# Patient Record
Sex: Female | Born: 1965 | Hispanic: Yes | Marital: Single | Smoking: Never smoker
Health system: Southern US, Community
[De-identification: ages and names within clinical notes are randomized; demographics above are authoritative.]

## PROBLEM LIST (undated history)

## (undated) DIAGNOSIS — K219 Gastro-esophageal reflux disease without esophagitis: Secondary | ICD-10-CM

## (undated) DIAGNOSIS — E785 Hyperlipidemia, unspecified: Secondary | ICD-10-CM

## (undated) DIAGNOSIS — M51369 Other intervertebral disc degeneration, lumbar region without mention of lumbar back pain or lower extremity pain: Secondary | ICD-10-CM

## (undated) DIAGNOSIS — R112 Nausea with vomiting, unspecified: Secondary | ICD-10-CM

## (undated) DIAGNOSIS — T8859XA Other complications of anesthesia, initial encounter: Secondary | ICD-10-CM

## (undated) DIAGNOSIS — G5600 Carpal tunnel syndrome, unspecified upper limb: Secondary | ICD-10-CM

## (undated) DIAGNOSIS — G4730 Sleep apnea, unspecified: Secondary | ICD-10-CM

## (undated) DIAGNOSIS — I10 Essential (primary) hypertension: Secondary | ICD-10-CM

## (undated) DIAGNOSIS — J45909 Unspecified asthma, uncomplicated: Secondary | ICD-10-CM

## (undated) HISTORY — PX: TRIGGER FINGER RELEASE: SHX641

## (undated) HISTORY — PX: GASTRIC ROUX-EN-Y: SHX5262

## (undated) HISTORY — PX: CARPAL TUNNEL RELEASE: SHX101

## (undated) HISTORY — PX: HERNIA REPAIR: SHX51

## (undated) HISTORY — PX: BREAST EXCISIONAL BIOPSY: SUR124

## (undated) HISTORY — PX: TURBINATE REDUCTION: SHX6157

## (undated) HISTORY — PX: ABDOMINAL HYSTERECTOMY: SHX81

---

## 2012-09-18 DIAGNOSIS — I10 Essential (primary) hypertension: Secondary | ICD-10-CM | POA: Insufficient documentation

## 2012-09-18 DIAGNOSIS — F411 Generalized anxiety disorder: Secondary | ICD-10-CM | POA: Insufficient documentation

## 2012-11-01 DIAGNOSIS — G4733 Obstructive sleep apnea (adult) (pediatric): Secondary | ICD-10-CM | POA: Insufficient documentation

## 2012-11-01 DIAGNOSIS — G4700 Insomnia, unspecified: Secondary | ICD-10-CM | POA: Insufficient documentation

## 2012-11-01 DIAGNOSIS — M791 Myalgia, unspecified site: Secondary | ICD-10-CM | POA: Insufficient documentation

## 2012-11-30 DIAGNOSIS — G5600 Carpal tunnel syndrome, unspecified upper limb: Secondary | ICD-10-CM | POA: Insufficient documentation

## 2013-06-01 ENCOUNTER — Other Ambulatory Visit (HOSPITAL_BASED_OUTPATIENT_CLINIC_OR_DEPARTMENT_OTHER): Payer: Self-pay | Admitting: Internal Medicine

## 2013-06-01 DIAGNOSIS — Z1231 Encounter for screening mammogram for malignant neoplasm of breast: Secondary | ICD-10-CM

## 2013-06-01 DIAGNOSIS — N83519 Torsion of ovary and ovarian pedicle, unspecified side: Secondary | ICD-10-CM

## 2013-06-08 ENCOUNTER — Ambulatory Visit (HOSPITAL_BASED_OUTPATIENT_CLINIC_OR_DEPARTMENT_OTHER)
Admission: RE | Admit: 2013-06-08 | Discharge: 2013-06-08 | Disposition: A | Discharge status: Home / Self Care | Payer: Medicare Other | Source: Ambulatory Visit | Attending: Internal Medicine | Admitting: Internal Medicine

## 2013-06-08 ENCOUNTER — Other Ambulatory Visit: Payer: Self-pay | Admitting: Internal Medicine

## 2013-06-08 ENCOUNTER — Other Ambulatory Visit (HOSPITAL_BASED_OUTPATIENT_CLINIC_OR_DEPARTMENT_OTHER): Payer: Medicare Other

## 2013-06-08 DIAGNOSIS — Z90710 Acquired absence of both cervix and uterus: Secondary | ICD-10-CM | POA: Diagnosis not present

## 2013-06-08 DIAGNOSIS — N8353 Torsion of ovary, ovarian pedicle and fallopian tube: Secondary | ICD-10-CM | POA: Insufficient documentation

## 2013-06-08 DIAGNOSIS — N83519 Torsion of ovary and ovarian pedicle, unspecified side: Secondary | ICD-10-CM

## 2013-06-08 DIAGNOSIS — Z1231 Encounter for screening mammogram for malignant neoplasm of breast: Secondary | ICD-10-CM

## 2013-06-08 IMAGING — US US ART/VEN ABD/PELV/SCROTUM DOPPLER LTD
1 series · 13 of 25 positions shown · non-contrast
Comparison: None.

CLINICAL DATA: Evaluate for ovarian torsion. History of
hysterectomy.

EXAM:
TRANSABDOMINAL AND TRANSVAGINAL ULTRASOUND OF PELVIS
DOPPLER ULTRASOUND OF OVARIES
TECHNIQUE: Both transabdominal and transvaginal ultrasound examinations of the
pelvis were performed. Transabdominal technique was performed for
global imaging of the pelvis including uterus, ovaries, adnexal
regions, and pelvic cul-de-sac.
It was necessary to proceed with endovaginal exam following the
transabdominal exam to visualize the uterus, endometrium, ovaries,
adnexal region. Color and duplex Doppler ultrasound was utilized to
evaluate blood flow to the ovaries.

[Series 1: us art/ven abd/pelv/scrotum doppler ltd · 0.30mm/px · 13 of 51 slices shown]
[im 1/51]
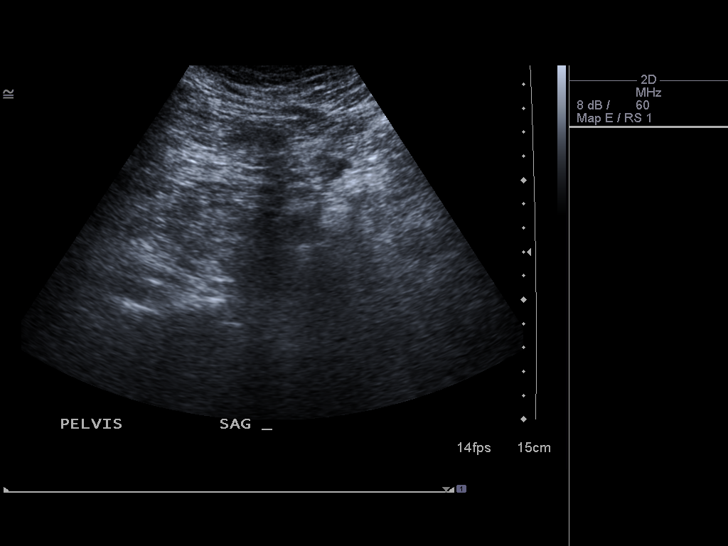
[im 5/51]
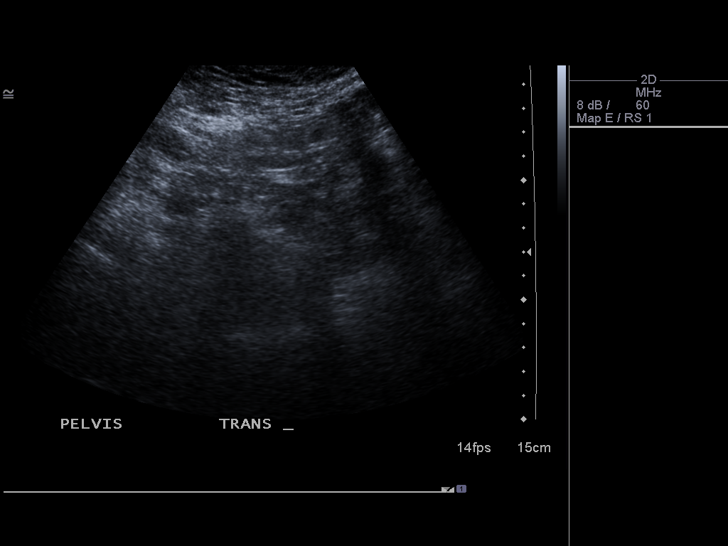
[im 9/51]
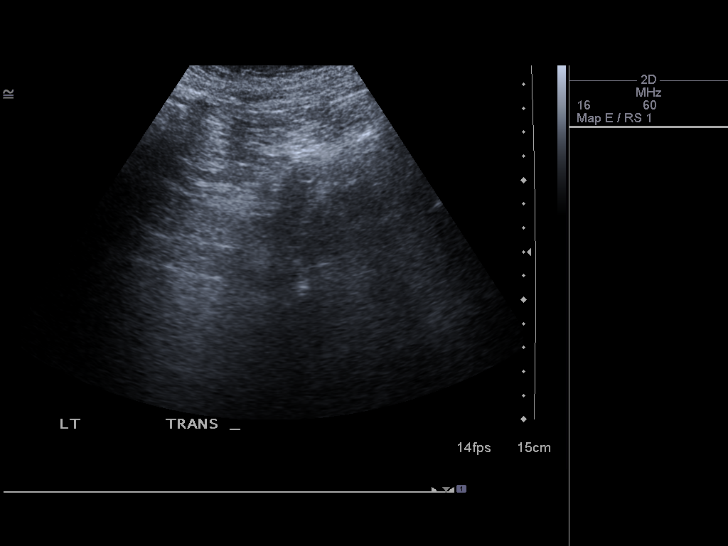
[im 13/51]
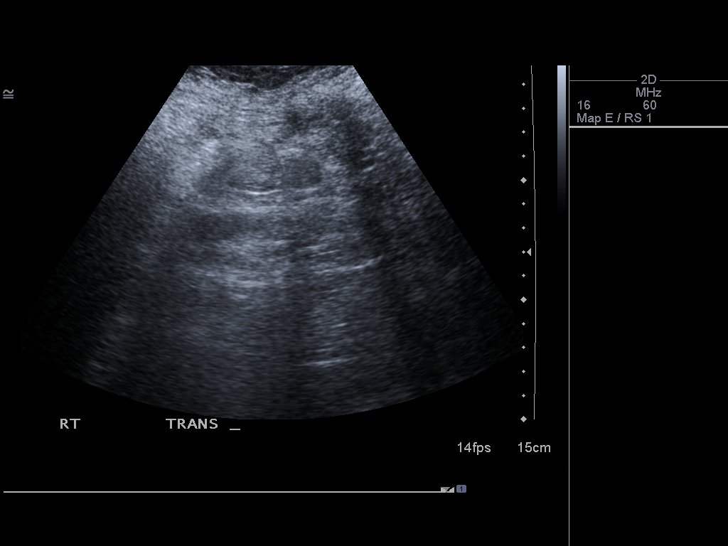
[im 17/51]
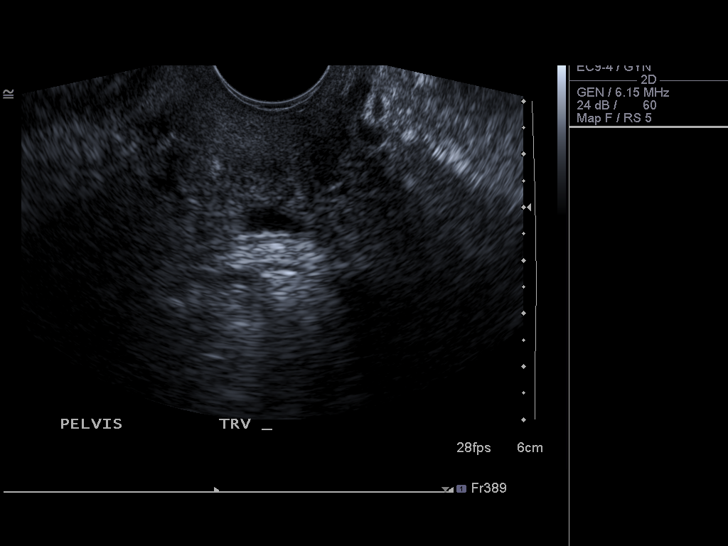
[im 21/51]
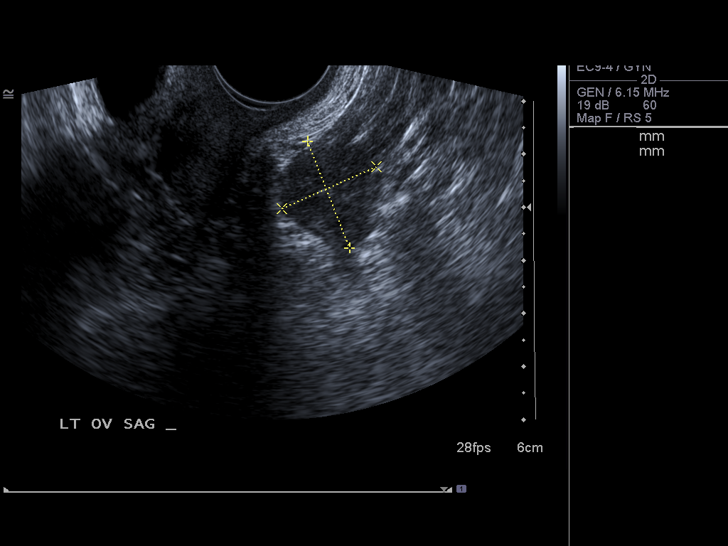
[im 26/51]
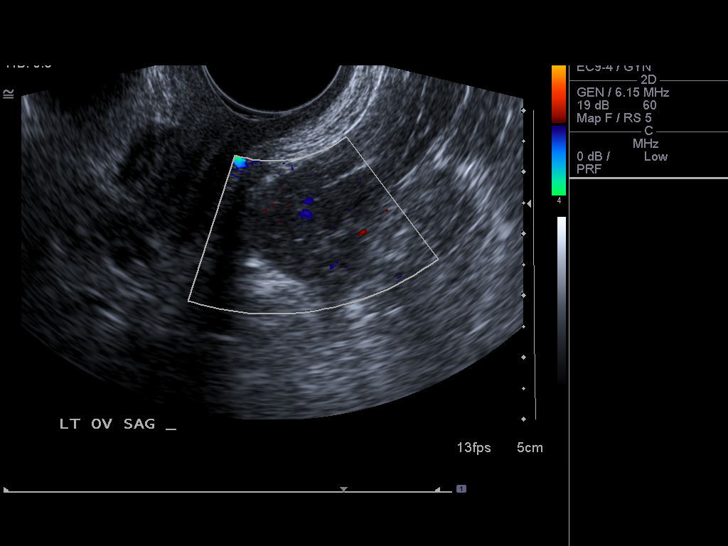
[im 30/51]
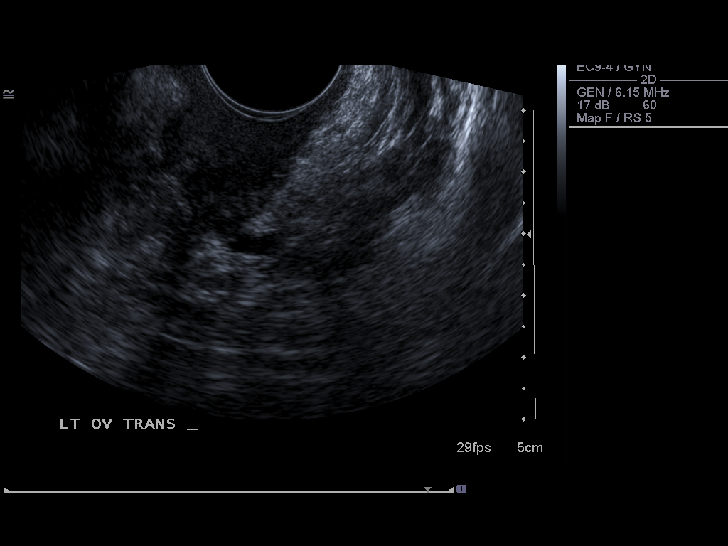
[im 34/51]
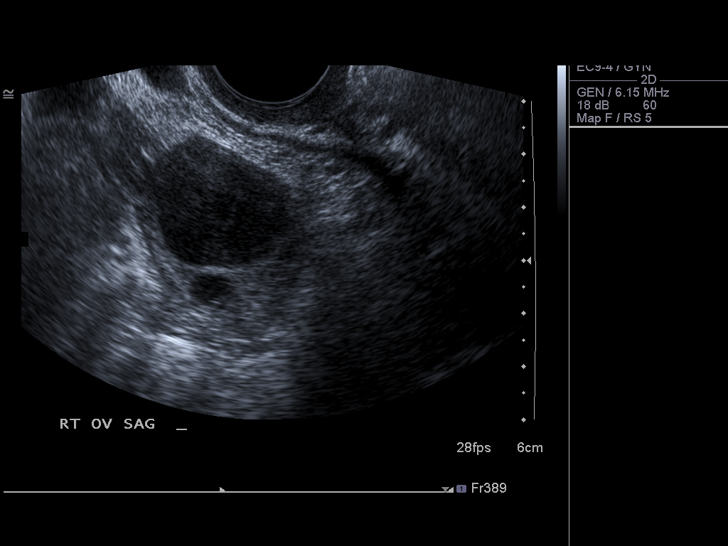
[im 38/51]
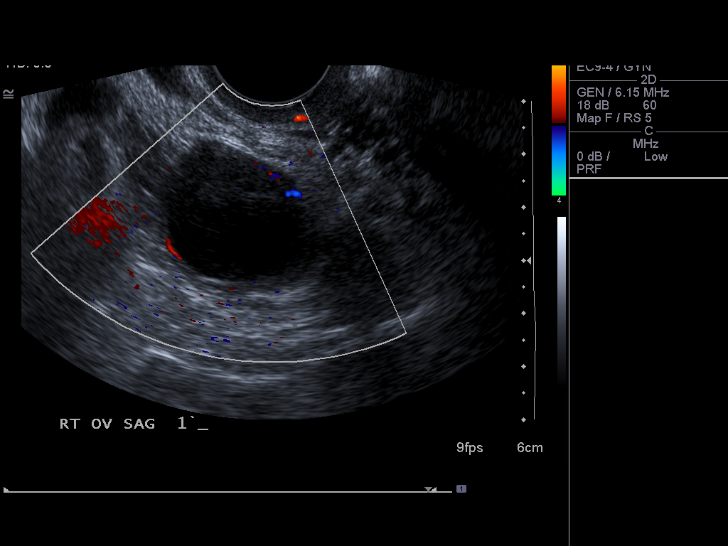
[im 42/51]
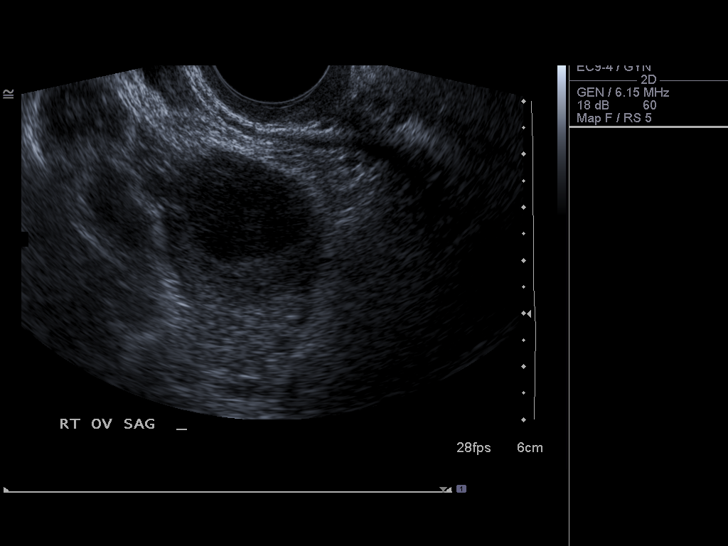
[im 46/51]
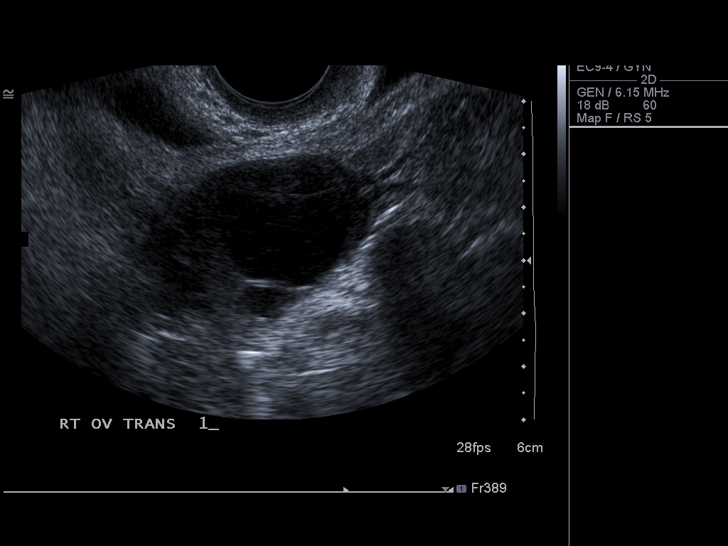
[im 51/51]
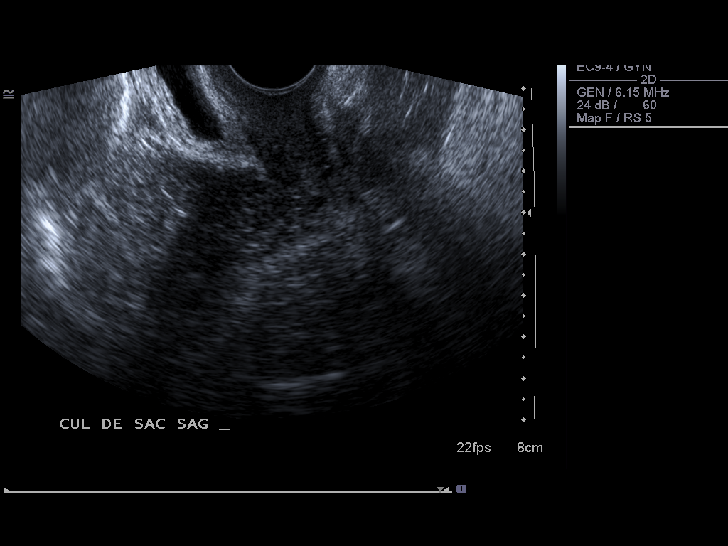

[13 of 25 positions shown; findings below may reference images not displayed]

FINDINGS: Uterus

Measurements: The uterus is absent.

Endometrium

Thickness: The uterus is absent.

Right ovary

Measurements: 3.8 x 3.1 x 4.2 cm. Small hemorrhagic cyst is 2.0 x
2.5 x 2.3 cm.

Left ovary

Measurements: 2.2 x 2.0 x 1.6 cm. Normal appearance/no adnexal mass.

Pulsed Doppler evaluation of both ovaries demonstrates normal
low-resistance arterial and venous waveforms.

Other findings

No free fluid.
IMPRESSION: 1. Status post hysterectomy.
2. Normal appearance of both ovaries. Incidental note is made of
small hemorrhagic right ovarian cyst. No specific follow-up is felt
to be necessary for this benign lesion.
3. No evidence for torsion.

1.

## 2014-02-27 DIAGNOSIS — K589 Irritable bowel syndrome without diarrhea: Secondary | ICD-10-CM | POA: Insufficient documentation

## 2014-02-27 DIAGNOSIS — F3342 Major depressive disorder, recurrent, in full remission: Secondary | ICD-10-CM | POA: Insufficient documentation

## 2014-02-27 DIAGNOSIS — F32A Depression, unspecified: Secondary | ICD-10-CM | POA: Insufficient documentation

## 2014-02-28 DIAGNOSIS — E785 Hyperlipidemia, unspecified: Secondary | ICD-10-CM | POA: Insufficient documentation

## 2014-05-29 DIAGNOSIS — K439 Ventral hernia without obstruction or gangrene: Secondary | ICD-10-CM | POA: Insufficient documentation

## 2014-12-27 DIAGNOSIS — J302 Other seasonal allergic rhinitis: Secondary | ICD-10-CM | POA: Insufficient documentation

## 2015-05-26 DIAGNOSIS — M51369 Other intervertebral disc degeneration, lumbar region without mention of lumbar back pain or lower extremity pain: Secondary | ICD-10-CM | POA: Insufficient documentation

## 2015-05-26 DIAGNOSIS — M47816 Spondylosis without myelopathy or radiculopathy, lumbar region: Secondary | ICD-10-CM | POA: Insufficient documentation

## 2015-05-26 DIAGNOSIS — M5412 Radiculopathy, cervical region: Secondary | ICD-10-CM | POA: Insufficient documentation

## 2015-06-24 DIAGNOSIS — M47812 Spondylosis without myelopathy or radiculopathy, cervical region: Secondary | ICD-10-CM | POA: Insufficient documentation

## 2017-03-10 DIAGNOSIS — K219 Gastro-esophageal reflux disease without esophagitis: Secondary | ICD-10-CM | POA: Insufficient documentation

## 2017-03-10 DIAGNOSIS — K760 Fatty (change of) liver, not elsewhere classified: Secondary | ICD-10-CM | POA: Insufficient documentation

## 2017-03-10 DIAGNOSIS — K638219 Small intestinal bacterial overgrowth, unspecified: Secondary | ICD-10-CM | POA: Insufficient documentation

## 2017-05-19 HISTORY — PX: SYNOVECTOMY: SHX133

## 2017-09-29 DIAGNOSIS — R7303 Prediabetes: Secondary | ICD-10-CM | POA: Insufficient documentation

## 2019-04-18 DIAGNOSIS — E079 Disorder of thyroid, unspecified: Secondary | ICD-10-CM | POA: Insufficient documentation

## 2020-10-20 DIAGNOSIS — N9089 Other specified noninflammatory disorders of vulva and perineum: Secondary | ICD-10-CM | POA: Insufficient documentation

## 2020-10-20 DIAGNOSIS — N951 Menopausal and female climacteric states: Secondary | ICD-10-CM | POA: Insufficient documentation

## 2020-10-20 DIAGNOSIS — N898 Other specified noninflammatory disorders of vagina: Secondary | ICD-10-CM | POA: Insufficient documentation

## 2020-11-24 DIAGNOSIS — G8929 Other chronic pain: Secondary | ICD-10-CM | POA: Insufficient documentation

## 2020-11-26 DIAGNOSIS — M2550 Pain in unspecified joint: Secondary | ICD-10-CM | POA: Insufficient documentation

## 2021-11-26 DIAGNOSIS — M359 Systemic involvement of connective tissue, unspecified: Secondary | ICD-10-CM | POA: Insufficient documentation

## 2021-11-26 DIAGNOSIS — Z79899 Other long term (current) drug therapy: Secondary | ICD-10-CM | POA: Insufficient documentation

## 2023-03-23 ENCOUNTER — Telehealth: Payer: Self-pay

## 2023-03-23 ENCOUNTER — Encounter: Payer: Self-pay | Admitting: Plastic Surgery

## 2023-03-23 ENCOUNTER — Ambulatory Visit (INDEPENDENT_AMBULATORY_CARE_PROVIDER_SITE_OTHER): Payer: 59 | Admitting: Plastic Surgery

## 2023-03-23 VITALS — BP 112/76 | HR 72 | Ht 59.0 in | Wt 196.0 lb

## 2023-03-23 DIAGNOSIS — M542 Cervicalgia: Secondary | ICD-10-CM | POA: Diagnosis not present

## 2023-03-23 DIAGNOSIS — N6459 Other signs and symptoms in breast: Secondary | ICD-10-CM | POA: Diagnosis not present

## 2023-03-23 DIAGNOSIS — M546 Pain in thoracic spine: Secondary | ICD-10-CM

## 2023-03-23 NOTE — Progress Notes (Signed)
Referring Provider Jackie Plum, MD [address] [place],  [place] [address]   CC:  Chief Complaint  Patient presents with   Advice Only   Skin Problem      Kimberly Barr is an 58 y.o. female.  HPI: Kimberly Barr is a 25-year-old female who presents today with complaints of upper back and neck pain for many years which she attributes to large size of her breast.  Kimberly Barr speaks English however it is not her native language and we elected to have an interpreter present, Reyne Dumas, for the appointment.  Kimberly Barr states that she has a history of arthritis and degenerative disc disease in her neck.  She states that her primary care provider has suggested that a breast reduction may improve her upper back and neck pain.  On discussion with her she has a history of some type of fluid collection within her right breast which she had drained many years ago.  She has no specific details about this but on inspection of her breast she has the traditional anchor scars from a breast reduction or mastopexy on both breast.  Past medical history includes fibromyalgia and muscle pain.  She also has arthritis and is on Plaquenil.  She has had chronic narcotic treatment for pain in the past but states that she is no longer taking these medications.  No Known Allergies  Outpatient Encounter Medications as of 03/23/2023  Medication Sig   ALPRAZolam (XANAX) 1 MG tablet Take 1 mg by mouth at bedtime.   cephALEXin (KEFLEX) 500 MG capsule Take 500 mg by mouth 3 (three) times daily.   gabapentin (NEURONTIN) 600 MG tablet 600 mg 3 (three) times daily.   hydroxychloroquine (PLAQUENIL) 200 MG tablet Take 200 mg by mouth 2 (two) times daily.   losartan-hydrochlorothiazide (HYZAAR) 100-25 MG tablet Take 1 tablet by mouth daily.   meclizine (ANTIVERT) 25 MG tablet Take 25 mg by mouth 3 (three) times daily.   meloxicam (MOBIC) 15 MG tablet Take 15 mg by mouth daily.   Milnacipran HCl 25 MG TABS  Take 50 mg by mouth at bedtime.   omeprazole (PRILOSEC) 20 MG capsule Take 1 capsule by mouth daily.   OZEMPIC, 1 MG/DOSE, 4 MG/3ML SOPN Inject 1 mg into the skin once a week.   rosuvastatin (CRESTOR) 10 MG tablet Take 10 mg by mouth at bedtime.   traZODone (DESYREL) 50 MG tablet Take 50 mg by mouth at bedtime.   No facility-administered encounter medications on file as of 03/23/2023.     No past medical history on file.    No family history on file.  Social History   Social History Narrative   Not on file     Review of Systems General: Denies fevers, chills, weight loss CV: Denies chest pain, shortness of breath, palpitations Breast: Patient feels that the size of her breast is contributing or at least worsening her upper back and neck pain.  She states that she did have a history of a fluid collection in her right breast which required drainage.  She specifically denies having had a previous breast reduction or breast lift.  Physical Exam    03/23/2023   11:19 AM  Vitals with BMI  Height 4\' 11"   Weight 196 lbs  BMI 39.57  Systolic 112  Diastolic 76  Pulse 72    General:  No acute distress,  Alert and oriented, Non-Toxic, Normal speech and affect Breast: Patient has moderately large breasts with  grade 2 ptosis.  There are well-healed scars around the areola vertically to the inframammary fold and into both inframammary folds.  There are no dominant masses on physical exam and the nipples are normal in appearance with the exception of the well-healed scars.  Her sternal notch to nipple distance on the right is 31 cm and 32 cm on the left the fold to nipple distance on the right is 11 cm and 12 cm on the left Mammogram: Patient states that she is up-to-date on her mammograms however I cannot find any results since 2015 Assessment/Plan Upper back and neck pain: Patient feels that her upper back and neck pain would be improved with a breast reduction.  I believe that I could  remove between 400 and 450 g.  per breast.  She understands that if most of her pain is due to her degenerative disc disease and her arthritis that a breast reduction will not improve this pain.  We discussed breast reductions at length.  She understands that I will use the previous scars for her breast reduct  Because she previous surgery and is unclear exactly what was done she has a much higher risk for nipple loss due to nipple ischemia.  We discussed the risks of bleeding, infection, and seroma formation.  We discussed the risk of DVT and the importance of early ambulation after surgery.  All questions were answered to her satisfaction.  Photographs were obtained today with her consent.  Will submit her for bilateral breast reduction at her request. Pain medical clearance for stopping her Plaquenil prior to and following surgery.  Will need clearance for stopping her Ozempic.  Will also need an agreement from her primary care physician that they will be responsible for pain management after the first 5 days of acute pain management.  Santiago Glad 03/23/2023, 1:01 PM

## 2023-03-24 ENCOUNTER — Telehealth: Payer: Self-pay

## 2023-03-24 NOTE — Telephone Encounter (Signed)
Faxed Request for Surgical Clearance request to Isabelle Course, PA-C with [facility] (Fax: [phone]/Phone: [phone]) to see if she can hold her Plaquenil and for duration before and after surgery.  Confirmation of receipt received.

## 2023-03-24 NOTE — Telephone Encounter (Signed)
Faxed Request for Surgical Clearance to Dr. Earney Navy with [facility] (Fax: [phone]/Phone: [phone]) of which she states he handles her Ozempic and pain management.   Confirmation of receipt received.

## 2023-03-29 ENCOUNTER — Telehealth: Payer: Self-pay

## 2023-03-29 NOTE — Telephone Encounter (Signed)
Received clearance from Isabelle Course, PA-C stating it is ok to hold Plaquenil before and after surgery. Sent back for clarification of comments.

## 2023-03-29 NOTE — Telephone Encounter (Signed)
Stated cleared for surgery and can hold Plaquenil but not necessary to do so.

## 2023-04-25 ENCOUNTER — Ambulatory Visit (INDEPENDENT_AMBULATORY_CARE_PROVIDER_SITE_OTHER): Payer: Medicare Other | Admitting: Student

## 2023-04-25 VITALS — BP 128/86 | HR 73

## 2023-04-25 DIAGNOSIS — M546 Pain in thoracic spine: Secondary | ICD-10-CM

## 2023-04-25 MED ORDER — OXYCODONE HCL 5 MG PO TABS: 5.0000 mg | ORAL_TABLET | Freq: Four times a day (QID) | ORAL | 0 refills | Status: AC | PRN

## 2023-04-25 MED ORDER — ONDANSETRON HCL 4 MG PO TABS: 4.0000 mg | ORAL_TABLET | Freq: Three times a day (TID) | ORAL | 0 refills | Status: DC | PRN

## 2023-04-25 NOTE — Progress Notes (Signed)
 Patient ID: Kimberly Barr, female    DOB: Oct 01, 1965, 58 y.o.   MRN: [id number]  Chief Complaint  Patient presents with   Pre-op Exam      ICD-10-CM   1. Bilateral thoracic back pain, unspecified chronicity  M54.6 MM 3D SCREENING MAMMOGRAM BILATERAL BREAST       History of Present Illness: Kimberly Barr is a 58 y.o.  female  with a history of macromastia.  She presents for preoperative evaluation for upcoming procedure, Bilateral Breast Reduction, scheduled for 05/13/23 with Dr.  Ladona Ridgel  Spanish interpreter was present at bedside.  Patient's partner was also present with her during this visit.  The patient states that in the past, she has needed more anesthesia as she metabolizes them anesthesia quickly.  She reports that she has had scopolamine patches in the past which she has done well with.  She denies any other issues with anesthesia.  Patient's last mammogram per the chart was in 2015.  She states that she has had a mammogram since then, but believes her most recent mammogram was in 2023.  She states that she has not had a mammogram within the last year.  She denies any personal family history of breast cancer.  She denies any history of cardiac disease.  She denies taking any blood thinners.  Patient reports she is not a smoker.  Patient denies taking any hormone replacement or birth control.  She denies any history of miscarriages.  She denies any personal or family history of blood clots or clotting diseases.  She denies any recent traumas, surgeries, infections.  She denies any history of stroke or heart attack.  She denies any history of Crohn's disease or ulcerative colitis.  She denies any history of COPD or asthma.  She denies any history of cancer.  Patient states that she recently has been having hot flashes due to menopause, but otherwise denies any recent fevers, chills or changes in her health.  Summary of Previous Visit: Patient was seen for consult by Dr. Ladona Ridgel on  03/23/2023.  At this visit, she complained of upper back and neck pain for many years which she attributed to the large size of her breasts.  She reported that her PCP had suggested a breast reduction may improve her upper back and neck pain.  Patient also reported that she had some type of fluid collection within her right breast which she had drained many years ago, but she had no specific details about what this was.  On exam, her breast had the traditional anchor scars from a breast reduction or mastopexy on both of the breasts.  On exam, her STN on the right was 31 cm and her STN on the left was 32 cm.  Per chart review, clearance was sent to Dr. Holley Bouche, patient's pain management provider.  Per Dr. Holley Bouche, is okay for the surgeon to manage postoperative pain medication if needed and patient may hold her Ozempic.  Patient states that she has not taken any pain medication in the past year.  Clearance was also sent to patient's rheumatology provider, Isabelle Course, PA-C.  Per Isabelle Course, PA-C, in regards to patient's Plaquenil, "may hold if desired but not necessary Plaquenil does not lower immunity"  Estimated excess breast tissue to be removed at time of surgery: 400-450 grams  Job: Does not work at this time  PMH Significant for: Hypertension, OSA, GERD, IBS, depression, insomnia, myalgia, mammary hypertrophy   Past Medical History: Allergies: No Known  Allergies  Current Medications:  Current Outpatient Medications:    ALPRAZolam (XANAX) 1 MG tablet, Take 1 mg by mouth at bedtime., Disp: , Rfl:    cephALEXin (KEFLEX) 500 MG capsule, Take 500 mg by mouth 3 (three) times daily., Disp: , Rfl:    gabapentin (NEURONTIN) 600 MG tablet, 600 mg 3 (three) times daily., Disp: , Rfl:    hydroxychloroquine (PLAQUENIL) 200 MG tablet, Take 200 mg by mouth 2 (two) times daily., Disp: , Rfl:    losartan-hydrochlorothiazide (HYZAAR) 100-25 MG tablet, Take 1 tablet by mouth daily., Disp: , Rfl:     meclizine (ANTIVERT) 25 MG tablet, Take 25 mg by mouth 3 (three) times daily., Disp: , Rfl:    meloxicam (MOBIC) 15 MG tablet, Take 15 mg by mouth daily., Disp: , Rfl:    Milnacipran HCl 25 MG TABS, Take 50 mg by mouth at bedtime., Disp: , Rfl:    omeprazole (PRILOSEC) 20 MG capsule, Take 1 capsule by mouth daily., Disp: , Rfl:    OZEMPIC, 1 MG/DOSE, 4 MG/3ML SOPN, Inject 1 mg into the skin once a week., Disp: , Rfl:    rosuvastatin (CRESTOR) 10 MG tablet, Take 10 mg by mouth at bedtime., Disp: , Rfl:    traZODone (DESYREL) 50 MG tablet, Take 50 mg by mouth at bedtime., Disp: , Rfl:   Past Medical Problems: No past medical history on file.  Past Surgical History: No past surgical history on file.  Social History: Social History   Socioeconomic History   Marital status: Single    Spouse name: Not on file   Number of children: Not on file   Years of education: Not on file   Highest education level: Not on file  Occupational History   Not on file  Tobacco Use   Smoking status: Not on file   Smokeless tobacco: Not on file  Substance and Sexual Activity   Alcohol use: Not on file   Drug use: Not on file   Sexual activity: Not on file  Other Topics Concern   Not on file  Social History Narrative   Not on file   Social Drivers of Health   Financial Resource Strain: Not on file  Food Insecurity: Not on file  Transportation Needs: Not on file  Physical Activity: Not on file  Stress: Not on file  Social Connections: Not on file  Intimate Partner Violence: Not on file    Family History: No family history on file.  Review of Systems: Denies any recent fevers, chills or changes in her health  Physical Exam: Vital Signs BP 128/86 (BP Location: Left Arm, Patient Position: Sitting, Cuff Size: Large)   Pulse 73   SpO2 96%   Physical Exam  Constitutional:      General: Not in acute distress.    Appearance: Normal appearance. Not ill-appearing.  HENT:     Head:  Normocephalic and atraumatic.  Neck:     Musculoskeletal: Normal range of motion.  Cardiovascular:     Rate and Rhythm: Normal rate Pulmonary:     Effort: Pulmonary effort is normal. No respiratory distress.  Musculoskeletal: Normal range of motion.  Skin:    General: Skin is warm and dry.     Findings: No erythema or rash.  Neurological:     Mental Status: Alert and oriented to person, place, and time. Mental status is at baseline.  Psychiatric:        Mood and Affect: Mood normal.  Behavior: Behavior normal.    Assessment/Plan: The patient is scheduled for bilateral breast reduction with Dr. Ladona Ridgel.  Risks, benefits, and alternatives of procedure discussed, questions answered and consent obtained.    Smoking Status: Non-smoker; Counseling Given?  N/A Last Mammogram: Patient thinks that her last mammogram was in 2023.  Will go ahead and order another mammogram.  Discussed with patient that this has to be done prior to surgery.  Patient expressed understanding.  Caprini Score: 4; Risk Factors include: Age, BMI > 25, and length of planned surgery. Recommendation for mechanical prophylaxis. Encourage early ambulation.   Pictures obtained: @consult   Post-op Rx sent to pharmacy: Oxycodone, Zofran -patient and patient's partner were requesting Narcan be prescribed as this is what her pain management physician would do when prescribing narcotics.  Will send in 1 Narcan nasal spray no refills.  This was called into the pharmacy.  Instructed patient to hold her Xanax, gabapentin, losartan-HCTZ, and trazodone the day of surgery.  Discussed with her the she should hold her meloxicam 1 week prior to surgery.  Instructed her to hold her Ozempic the dose before surgery.  Instructed her to hold her Plaquenil from now until surgery and then 1 month after surgery.  I also did caution the patient to not take oxycodone at the same time as her trazodone, gabapentin or Xanax as these can be sedating.   Patient expressed understanding.  Patient expressed understanding.  Patient was provided with the breast reduction and General Surgical Risk consent document and Pain Medication Agreement prior to their appointment.  They had adequate time to read through the risk consent documents and Pain Medication Agreement. We also discussed them in person together during this preop appointment. All of their questions were answered to their satisfaction.  Recommended calling if they have any further questions.  Risk consent form and Pain Medication Agreement to be scanned into patient's chart.  The risk that can be encountered with breast reduction were discussed and include the following but not limited to these:  Breast asymmetry, fluid accumulation, firmness of the breast, inability to breast feed, loss of nipple or areola, skin loss, decrease or no nipple sensation, fat necrosis of the breast tissue, bleeding, infection, healing delay.  There are risks of anesthesia, changes to skin sensation and injury to nerves or blood vessels.  The muscle can be temporarily or permanently injured.  You may have an allergic reaction to tape, suture, glue, blood products which can result in skin discoloration, swelling, pain, skin lesions, poor healing.  Any of these can lead to the need for revisonal surgery or stage procedures.  A reduction has potential to interfere with diagnostic procedures.  Nipple or breast piercing can increase risks of infection.  This procedure is best done when the breast is fully developed.  Changes in the breast will continue to occur over time.  Pregnancy can alter the outcomes of previous breast reduction surgery, weight gain and weigh loss can also effect the long term appearance.   Discussed with patient that her history of OSA may put her at increased risk of complications with anesthesia.    Electronically signed by: Laurena Spies, PA-C 04/25/2023 11:24 AM

## 2023-05-03 ENCOUNTER — Ambulatory Visit
Admission: RE | Admit: 2023-05-03 | Discharge: 2023-05-03 | Disposition: A | Discharge status: Home / Self Care | Source: Ambulatory Visit | Attending: Student | Admitting: Student

## 2023-05-03 DIAGNOSIS — M546 Pain in thoracic spine: Secondary | ICD-10-CM

## 2023-05-06 ENCOUNTER — Encounter (HOSPITAL_BASED_OUTPATIENT_CLINIC_OR_DEPARTMENT_OTHER): Payer: Self-pay | Admitting: Plastic Surgery

## 2023-05-06 ENCOUNTER — Other Ambulatory Visit: Payer: Self-pay

## 2023-05-09 ENCOUNTER — Encounter (HOSPITAL_BASED_OUTPATIENT_CLINIC_OR_DEPARTMENT_OTHER)
Admission: RE | Admit: 2023-05-09 | Discharge: 2023-05-09 | Disposition: A | Discharge status: Home / Self Care | Source: Ambulatory Visit | Attending: Plastic Surgery | Admitting: Plastic Surgery

## 2023-05-09 ENCOUNTER — Other Ambulatory Visit: Payer: Self-pay

## 2023-05-09 ENCOUNTER — Encounter: Payer: Medicare Other | Admitting: Student

## 2023-05-09 DIAGNOSIS — Z01812 Encounter for preprocedural laboratory examination: Secondary | ICD-10-CM | POA: Diagnosis present

## 2023-05-09 LAB — BASIC METABOLIC PANEL
Anion gap: 7 (ref 5–15)
BUN: 18 mg/dL (ref 6–20)
CO2: 28 mmol/L (ref 22–32)
Calcium: 9.3 mg/dL (ref 8.9–10.3)
Chloride: 103 mmol/L (ref 98–111)
Creatinine, Ser: 0.81 mg/dL (ref 0.44–1.00)
GFR, Estimated: >60 mL/min (ref >60)
Glucose, Bld: 91 mg/dL (ref 70–99)
Potassium: 5.2 mmol/L — ABNORMAL HIGH (ref 3.5–5.1)
Sodium: 138 mmol/L (ref 135–145)

## 2023-05-09 NOTE — Progress Notes (Signed)
 Provided patient with presurgical soap and Ensure. Provided pre-op instructions. Pt verbalized understanding

## 2023-05-13 ENCOUNTER — Encounter (HOSPITAL_BASED_OUTPATIENT_CLINIC_OR_DEPARTMENT_OTHER): Payer: Self-pay

## 2023-05-13 ENCOUNTER — Ambulatory Visit (HOSPITAL_BASED_OUTPATIENT_CLINIC_OR_DEPARTMENT_OTHER): Admit: 2023-05-13 | Payer: Medicare Other | Admitting: Plastic Surgery

## 2023-05-13 DIAGNOSIS — I10 Essential (primary) hypertension: Secondary | ICD-10-CM

## 2023-05-13 HISTORY — DX: Other complications of anesthesia, initial encounter: T88.59XA

## 2023-05-13 HISTORY — DX: Carpal tunnel syndrome, unspecified upper limb: G56.00

## 2023-05-13 HISTORY — DX: Gastro-esophageal reflux disease without esophagitis: K21.9

## 2023-05-13 HISTORY — DX: Unspecified asthma, uncomplicated: J45.909

## 2023-05-13 HISTORY — DX: Hyperlipidemia, unspecified: E78.5

## 2023-05-13 HISTORY — DX: Other intervertebral disc degeneration, lumbar region without mention of lumbar back pain or lower extremity pain: M51.369

## 2023-05-13 HISTORY — DX: Sleep apnea, unspecified: G47.30

## 2023-05-13 HISTORY — DX: Essential (primary) hypertension: I10

## 2023-05-13 SURGERY — MAMMOPLASTY, REDUCTION
Anesthesia: Choice | Laterality: Bilateral

## 2023-05-16 ENCOUNTER — Encounter: Payer: Medicare Other | Admitting: Student

## 2023-05-23 ENCOUNTER — Encounter: Payer: Medicare Other | Admitting: Plastic Surgery

## 2023-06-17 ENCOUNTER — Encounter: Payer: Medicare Other | Admitting: Student

## 2023-11-21 ENCOUNTER — Ambulatory Visit: Admitting: Plastic Surgery

## 2023-11-21 VITALS — BP 115/77 | HR 85 | Ht 59.0 in | Wt 196.0 lb

## 2023-11-21 DIAGNOSIS — M546 Pain in thoracic spine: Secondary | ICD-10-CM

## 2023-11-21 DIAGNOSIS — N62 Hypertrophy of breast: Secondary | ICD-10-CM

## 2023-11-21 DIAGNOSIS — M542 Cervicalgia: Secondary | ICD-10-CM | POA: Diagnosis not present

## 2023-11-21 NOTE — Progress Notes (Signed)
 Ms. Kimberly Barr returns today to discuss rescheduling her breast reduction.  She was scheduled in March but the surgery was canceled.  She is still interested in a reduction due to the fact that she still has persistent upper back and neck pain.    Estimated weight for reduction was 400 g to 450 g per breast  Patient states she does not have any additional questions and is clear on the surgery specifically the risks and the postoperative limitations.  Will resubmit her for a breast reduction.

## 2023-12-29 ENCOUNTER — Ambulatory Visit: Admitting: Physician Assistant

## 2023-12-29 VITALS — BP 116/80 | HR 64 | Ht 59.0 in | Wt 197.0 lb

## 2023-12-29 DIAGNOSIS — N62 Hypertrophy of breast: Secondary | ICD-10-CM | POA: Diagnosis not present

## 2023-12-29 NOTE — Progress Notes (Signed)
 Patient ID: Kimberly Barr, female    DOB: 1965-09-13, 58 y.o.   MRN: [id number]  Chief Complaint  Patient presents with   preop    bilateral breast reduction      ICD-10-CM   1. Macromastia  N62        History of Present Illness: Kimberly Barr is a 58 y.o.  female  with a history of macromastia.  She presents for preoperative evaluation for upcoming procedure, bilateral breast reduction, scheduled for 01/17/2024 with Dr. Waddell.  The patient reports that she metabolizes anesthesia quickly.  She also has developed moderate PONV after each surgery, but did better with placement of scopolamine patch.  She is accompanied by medical interpreter at bedside.  She denies any personal or family history of blood clots or clotting disorder.  Denies any personal history of cancer, nicotine use, significant cardiac or pulmonary disease, or varicosities.  She has pain throughout her entire body, but works with the chronic pain management team and does not require narcotic analgesics.  She states that she still has her medications that were called in at her last preoperative appointment.  She reports that her last dose of Plaquenil was last evening and understands that it needs to be held 3 weeks before and after surgery at minimum, per Dr. Waddell.  Will need to clarify with Dr. Waddell that surgery does not need to be postponed.  She also understands that her Wegovy and Mobic will need to be held the week of surgery.  Discussed expectations postoperatively and patient is agreeable.  She states that she would like to be as small as safely possible. Spoke with Dr. Waddell, agreed to proceed as scheduled.  She is to hold off on restarting her Plaquenil until she is well-healed from surgery.  Called and communicated with patient who voiced understanding.  Summary of Previous Visit: Patient was seen for initial consult by Dr. Waddell 03/23/2023.  She then was scheduled for breast reduction surgery in March, but  it was ultimately canceled due to an insurance issue, per the patient.  At her initial consult, she complained of chronic upper back and neck discomfort in the context of large breasts.  STN on the right was 31 cm, 32 cm in the left.  Clearance had previously been obtained by rheumatology PA about holding Plaquenil if needed.  Estimated tissue to be removed at time of surgery close 400 to 450 g each side.  Job: Disability.  PMH Significant for: Macromastia, HTN, OSA, GERD, anxiety, connective tissue disease on Plaquenil, polyarthralgia, fibromyalgia.   Past Medical History: Allergies: No Known Allergies  Current Medications:  Current Outpatient Medications:    ALPRAZolam (XANAX) 1 MG tablet, Take 1 mg by mouth at bedtime., Disp: , Rfl:    gabapentin (NEURONTIN) 600 MG tablet, 600 mg 3 (three) times daily., Disp: , Rfl:    hydroxychloroquine (PLAQUENIL) 200 MG tablet, Take 200 mg by mouth 2 (two) times daily., Disp: , Rfl:    losartan-hydrochlorothiazide (HYZAAR) 100-25 MG tablet, Take 1 tablet by mouth daily., Disp: , Rfl:    meloxicam (MOBIC) 15 MG tablet, Take 15 mg by mouth daily., Disp: , Rfl:    omeprazole (PRILOSEC) 20 MG capsule, Take 1 capsule by mouth daily., Disp: , Rfl:    oxyCODONE  (ROXICODONE ) 5 MG immediate release tablet, Take 1 tablet (5 mg total) by mouth every 6 (six) hours as needed for up to 20 doses for severe pain (pain score 7-10)., Disp: 20 tablet,  Rfl: 0   OZEMPIC, 1 MG/DOSE, 4 MG/3ML SOPN, Inject 1 mg into the skin once a week., Disp: , Rfl:    rosuvastatin (CRESTOR) 10 MG tablet, Take 10 mg by mouth at bedtime., Disp: , Rfl:    sertraline (ZOLOFT) 100 MG tablet, Take 100 mg by mouth daily., Disp: , Rfl:    traZODone (DESYREL) 50 MG tablet, Take 50 mg by mouth at bedtime., Disp: , Rfl:   Past Medical Problems: Past Medical History:  Diagnosis Date   Asthma    Aware during previous breast surgery    CTS (carpal tunnel syndrome)    DDD (degenerative disc  disease), lumbar    GERD (gastroesophageal reflux disease)    HLD (hyperlipidemia)    Hypertension    Sleep apnea     Past Surgical History: Past Surgical History:  Procedure Laterality Date   ABDOMINAL HYSTERECTOMY     BREAST EXCISIONAL BIOPSY Bilateral    Patient stated liquid was removed from both breast after given birth, scars under both breast   CARPAL TUNNEL RELEASE     GASTRIC ROUX-EN-Y     2007   HERNIA REPAIR     SYNOVECTOMY  05/19/2017   Wrist   TRIGGER FINGER RELEASE     TURBINATE REDUCTION      Social History: Social History   Socioeconomic History   Marital status: Single    Spouse name: Not on file   Number of children: Not on file   Years of education: Not on file   Highest education level: Not on file  Occupational History   Not on file  Tobacco Use   Smoking status: Never   Smokeless tobacco: Never  Vaping Use   Vaping status: Never Used  Substance and Sexual Activity   Alcohol use: Never   Drug use: Never   Sexual activity: Not on file  Other Topics Concern   Not on file  Social History Narrative   Not on file   Social Drivers of Health   Financial Resource Strain: Not on file  Food Insecurity: Not on file  Transportation Needs: Not on file  Physical Activity: Not on file  Stress: Not on file  Social Connections: Not on file  Intimate Partner Violence: Not on file    Family History: No family history on file.  Review of Systems: ROS Denies any recent chest pain, difficulty breathing, leg swelling, fevers.  Physical Exam: Vital Signs BP 116/80   Pulse 64   Ht 4' 11 (1.499 m)   Wt 197 lb (89.4 kg)   SpO2 97%   BMI 39.79 kg/m   Physical Exam Constitutional:      General: Not in acute distress.    Appearance: Normal appearance. Not ill-appearing.  HENT:     Head: Normocephalic and atraumatic.  Eyes:     Pupils: Pupils are equal, round. Cardiovascular:     Rate and Rhythm: Normal rate.    Pulses: Normal pulses.   Pulmonary:     Effort: No respiratory distress or increased work of breathing.  Speaks in full sentences. Abdominal:     General: Abdomen is flat. No distension.   Musculoskeletal: Normal range of motion. No lower extremity swelling or edema. No varicosities. Skin:    General: Skin is warm and dry.     Findings: No erythema or rash.  Neurological:     Mental Status: Alert and oriented to person, place, and time.  Psychiatric:        Mood  and Affect: Mood normal.        Behavior: Behavior normal.    Assessment/Plan: The patient is scheduled for bilateral breast reduction with Dr. Waddell.  Risks, benefits, and alternatives of procedure discussed, questions answered and consent obtained.    Smoking Status: Non-smoker.  Last Mammogram: 04/2023; Results: BI-RADS Category 1: Negative.  Caprini Score: 4; Risk Factors include: Age, BMI greater than 25, and length of planned surgery. Recommendation for mechanical prophylaxis. Encourage early ambulation.   Pictures obtained: 03/23/2023  Post-op Rx sent to pharmacy: Patient confirms that she filled and still has her postoperative medications prescribed earlier this year.  Patient was provided with the General Surgical Risk consent document and Pain Medication Agreement prior to their appointment.  They had adequate time to read through the risk consent documents and Pain Medication Agreement. We also discussed them in person together during this preop appointment. All of their questions were answered to their satisfaction.  Recommended calling if they have any further questions.  Risk consent form and Pain Medication Agreement to be scanned into patient's chart.  The risk that can be encountered with breast reduction were discussed and include the following but not limited to these:  Breast asymmetry, fluid accumulation, firmness of the breast, inability to breast feed, loss of nipple or areola, skin loss, decrease or no nipple sensation, fat  necrosis of the breast tissue, bleeding, infection, healing delay.  There are risks of anesthesia, changes to skin sensation and injury to nerves or blood vessels.  The muscle can be temporarily or permanently injured.  You may have an allergic reaction to tape, suture, glue, blood products which can result in skin discoloration, swelling, pain, skin lesions, poor healing.  Any of these can lead to the need for revisonal surgery or stage procedures.  A reduction has potential to interfere with diagnostic procedures.  Nipple or breast piercing can increase risks of infection.  This procedure is best done when the breast is fully developed.  Changes in the breast will continue to occur over time.  Pregnancy can alter the outcomes of previous breast reduction surgery, weight gain and weigh loss can also effect the long term appearance.     Electronically signed by: Honora Seip, PA-C 12/29/2023 9:56 AM

## 2024-01-11 ENCOUNTER — Encounter (HOSPITAL_BASED_OUTPATIENT_CLINIC_OR_DEPARTMENT_OTHER): Payer: Self-pay | Admitting: Plastic Surgery

## 2024-01-11 ENCOUNTER — Other Ambulatory Visit: Payer: Self-pay

## 2024-01-17 DIAGNOSIS — Z01818 Encounter for other preprocedural examination: Secondary | ICD-10-CM

## 2024-01-18 ENCOUNTER — Encounter: Admitting: Physician Assistant

## 2024-01-23 ENCOUNTER — Encounter: Admitting: Plastic Surgery

## 2024-02-06 ENCOUNTER — Encounter: Admitting: Physician Assistant

## 2024-02-15 ENCOUNTER — Ambulatory Visit (HOSPITAL_COMMUNITY): Admission: RE | Admit: 2024-02-15 | Source: Home / Self Care | Admitting: Plastic Surgery

## 2024-02-15 DIAGNOSIS — Z01818 Encounter for other preprocedural examination: Secondary | ICD-10-CM

## 2024-02-15 HISTORY — DX: Nausea with vomiting, unspecified: R11.2
